# Patient Record
Sex: Male | Born: 2005 | Hispanic: Yes | Marital: Single | Smoking: Never smoker
Health system: Southern US, Community
[De-identification: ages and names within clinical notes are randomized; demographics above are authoritative.]

---
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
## 2024-05-19 ENCOUNTER — Other Ambulatory Visit: Payer: Self-pay

## 2024-05-19 ENCOUNTER — Encounter (HOSPITAL_BASED_OUTPATIENT_CLINIC_OR_DEPARTMENT_OTHER): Payer: Self-pay | Admitting: Emergency Medicine

## 2024-05-19 ENCOUNTER — Emergency Department (HOSPITAL_BASED_OUTPATIENT_CLINIC_OR_DEPARTMENT_OTHER)

## 2024-05-19 DIAGNOSIS — M25562 Pain in left knee: Secondary | ICD-10-CM | POA: Insufficient documentation

## 2024-05-19 NOTE — ED Triage Notes (Signed)
 Pt c/o L knee pain x 2 weeks after hurting it while playing soccer. Swelling improved from initial incident. Pain worse today.

## 2024-05-20 ENCOUNTER — Emergency Department (HOSPITAL_BASED_OUTPATIENT_CLINIC_OR_DEPARTMENT_OTHER)
Admission: EM | Admit: 2024-05-20 | Discharge: 2024-05-20 | Disposition: A | Attending: Emergency Medicine | Admitting: Emergency Medicine

## 2024-05-20 DIAGNOSIS — M25562 Pain in left knee: Secondary | ICD-10-CM

## 2024-05-20 MED ORDER — IBUPROFEN 400 MG PO TABS
400.0000 mg | ORAL_TABLET | Freq: Once | ORAL | Status: AC
  Administered 2024-05-20: 400 mg via ORAL
  Filled 2024-05-20: qty 1

## 2024-05-20 MED ORDER — ACETAMINOPHEN 500 MG PO TABS
1000.0000 mg | ORAL_TABLET | Freq: Once | ORAL | Status: AC
  Administered 2024-05-20: 1000 mg via ORAL
  Filled 2024-05-20: qty 2
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
## 2024-05-20 NOTE — Discharge Instructions (Signed)
 Ice the knee for 20 minutes every 2 hours.  Alternate tylenol  and ibuprofen
